# Patient Record
Sex: Female | Born: 1965
Health system: Southern US, Community
[De-identification: ages and names within clinical notes are randomized; demographics above are authoritative.]

## PROBLEM LIST (undated history)

## (undated) DIAGNOSIS — K635 Polyp of colon: Secondary | ICD-10-CM

## (undated) DIAGNOSIS — F329 Major depressive disorder, single episode, unspecified: Secondary | ICD-10-CM

## (undated) DIAGNOSIS — K219 Gastro-esophageal reflux disease without esophagitis: Secondary | ICD-10-CM

## (undated) DIAGNOSIS — F32A Depression, unspecified: Secondary | ICD-10-CM

## (undated) HISTORY — PX: TONSILLECTOMY: SUR1361

## (undated) HISTORY — DX: Polyp of colon: K63.5

## (undated) HISTORY — PX: CHOLECYSTECTOMY: SHX55

## (undated) HISTORY — PX: APPENDECTOMY: SHX54

## (undated) HISTORY — DX: Gastro-esophageal reflux disease without esophagitis: K21.9

## (undated) HISTORY — PX: TUBAL LIGATION: SHX77

## (undated) HISTORY — DX: Major depressive disorder, single episode, unspecified: F32.9

## (undated) HISTORY — DX: Depression, unspecified: F32.A

---

## 2017-06-04 ENCOUNTER — Ambulatory Visit (INDEPENDENT_AMBULATORY_CARE_PROVIDER_SITE_OTHER): Payer: 59 | Admitting: Family Medicine

## 2017-06-04 ENCOUNTER — Ambulatory Visit: Payer: Self-pay | Admitting: Family Medicine

## 2017-06-04 ENCOUNTER — Encounter: Payer: Self-pay | Admitting: Family Medicine

## 2017-06-04 VITALS — BP 110/76 | HR 74 | Temp 98.3°F | Ht 60.0 in | Wt 140.0 lb

## 2017-06-04 DIAGNOSIS — F329 Major depressive disorder, single episode, unspecified: Secondary | ICD-10-CM

## 2017-06-04 DIAGNOSIS — F32A Depression, unspecified: Secondary | ICD-10-CM

## 2017-06-04 DIAGNOSIS — F17210 Nicotine dependence, cigarettes, uncomplicated: Secondary | ICD-10-CM | POA: Diagnosis not present

## 2017-06-04 DIAGNOSIS — F5101 Primary insomnia: Secondary | ICD-10-CM | POA: Diagnosis not present

## 2017-06-04 DIAGNOSIS — Z7689 Persons encountering health services in other specified circumstances: Secondary | ICD-10-CM

## 2017-06-04 DIAGNOSIS — F419 Anxiety disorder, unspecified: Secondary | ICD-10-CM | POA: Diagnosis not present

## 2017-06-04 DIAGNOSIS — K219 Gastro-esophageal reflux disease without esophagitis: Secondary | ICD-10-CM

## 2017-06-04 MED ORDER — TRAZODONE HCL 100 MG PO TABS: 100.0000 mg | ORAL_TABLET | Freq: Every day | ORAL | 1 refills | Status: DC

## 2017-06-04 NOTE — Progress Notes (Addendum)
Patient presents to clinic today to establish care.  SUBJECTIVE: PMH:Pt is a 52 yo female with pmh sig for anxiety, depression, GERD, insomnia.  Pt was previously seen in [place] and at [facility].  GERD: Patient taking omeprazole times several years -Patient endorses having EGD 2016 and colonoscopy in 2014 and 2016. -Patient convinced she produces too much acid. -In the past had gallbladder work-up as it was thought to be the cause of her symptoms.  This was negative per patient.  Anxiety and depression: -Patient endorses ongoing history of both -Currently taking bupropion, venlafaxine, lorazepam, trazodone, hydroxyzine. -Patient states she has been to counseling in the past (2014), but did not feel like it was working for her. -Patient endorses decreased energy, decreased mood, decreased interest in doing things, decreased sleep. -Patient states she likes to leave things "in the past".  Patient states she is living for her grandchildren. -Patient states her mind races when she tries to go to sleep at night. -pt requesting refills.  States has been out of meds.  Has med bottles, rx's filled in numerous states.  Allergies: Aleve-facial edema, ulcers in mouth  Past surgical history: Appendectomy Right ankle repair status post fracture  Social history: Patient is single.  She is currently employed as a Academic librarian.  Patient has 2 children.  Patient endorses smoking 3 to 6 cigarettes/day  And using a vape pen without nicotine.  Patient states she has no desire to quit smoking.  Patient is not currently drinking.  Patient states she is an alcoholic and quit drinking cold [place] in 2014.  Family medical history: Mom-deceased, colon cancer Dad-deceased, COPD, heart disease  Health Maintenance: Immunizations --vaccine 2018, TB test 2019, tetanus vaccine 2018. Colonoscopy --2016 polyps noted and removed. Mammogram --2018 PAP -- 2018   Past Medical History:  Diagnosis Date    . Colon polyps   . Depression   . GERD (gastroesophageal reflux disease)     Past Surgical History:  Procedure Laterality Date  . APPENDECTOMY    . CHOLECYSTECTOMY    . TONSILLECTOMY      No current outpatient medications on file prior to visit.   No current facility-administered medications on file prior to visit.     Allergies  Allergen Reactions  . Aleve [Naproxen Sodium]     Family History  Problem Relation Age of Onset  . Cancer Mother   . Heart disease Mother   . COPD Father     Social History   Socioeconomic History  . Marital status: Unknown    Spouse name: Not on file  . Number of children: Not on file  . Years of education: Not on file  . Highest education level: Not on file  Occupational History  . Not on file  Social Needs  . Financial resource strain: Not on file  . Food insecurity:    Worry: Not on file    Inability: Not on file  . Transportation needs:    Medical: Not on file    Non-medical: Not on file  Tobacco Use  . Smoking status: Current Every Day Smoker    Packs/day: 0.50  . Smokeless tobacco: Current User  Substance and Sexual Activity  . Alcohol use: Never    Frequency: Never  . Drug use: Never  . Sexual activity: Not Currently  Lifestyle  . Physical activity:    Days per week: Not on file    Minutes per session: Not on file  . Stress: Not on file  Relationships  . Social connections:    Talks on phone: Not on file    Gets together: Not on file    Attends religious service: Not on file    Active member of club or organization: Not on file    Attends meetings of clubs or organizations: Not on file    Relationship status: Not on file  . Intimate partner violence:    Fear of current or ex partner: Not on file    Emotionally abused: Not on file    Physically abused: Not on file    Forced sexual activity: Not on file  Other Topics Concern  . Not on file  Social History Narrative  . Not on file    ROS General: Denies  fever, chills, night sweats, changes in weight, changes in appetite HEENT: Denies headaches, ear pain, changes in vision, rhinorrhea, sore throat CV: Denies CP, palpitations, SOB, orthopnea Pulm: Denies SOB, cough, wheezing GI: Denies abdominal pain, nausea, vomiting, diarrhea, constipation  + reflux GU: Denies dysuria, hematuria, frequency, vaginal discharge  Msk: Denies muscle cramps, joint pains Neuro: Denies weakness, numbness, tingling Skin: Denies rashes, bruising Psych: Denies hallucinations  +anxiety and depression  BP 110/76 (BP Location: Left Arm, Patient Position: Sitting, Cuff Size: Normal)   Pulse 74   Temp 98.3 F (36.8 C) (Oral)   Ht 5' (1.524 m)   Wt 140 lb (63.5 kg)   LMP 07/01/2014 (Approximate)   SpO2 99%   BMI 27.34 kg/m   Physical Exam Gen. Pleasant, well developed, well-nourished, in NAD HEENT - [place]/AT, PERRL, no scleral icterus, no nasal drainage, pharynx without erythema or exudate.  TMs normal bilaterally.  No cervical lymphadenopathy. Lungs: no use of accessory muscles, CTAB, no wheezes, rales or rhonchi Cardiovascular: RRR, No r/g/m, no peripheral edema Abdomen: BS present, soft, nontender, nondistended Neuro:  A&Ox3, CN II-XII intact, normal gait Skin:  Warm, dry, intact, no lesions.  Has several tattoos on UEs Psych: normal affect, tearful, mood liable/depressed.  No results found for this or any previous visit (from the past 2160 hour(s)).  Assessment/Plan: Anxiety and depression -PHQ 9 score 12 -GAD 7 score 6 -pt advised to continue current meds: Bupropion, venlafaxine, trazodone, hydroxyzine, lorazepam -Recommended pt find a psychiatric provider in the area.  Given a list.  Patient does not seem amenable to this plan. -Pt still requesting lorazepam. Advised this provider does not prescribe this medication.   Gastroesophageal reflux disease, esophagitis presence not specified -Continue omeprazole -Discussed avoiding foods known to cause  symptoms -Limit NSAID use  Cigarette nicotine dependence without complication -Smoking cessation counseling greater than 3 minutes, less than 10 minutes -Patient smoking 3 to 6 cigarettes/day -Patient is not interested in quitting -We will readdress at each visit  Primary insomnia -continue current meds:  Trazodone, hydroxyzine, lorazepam -pt advised to find a psychiatric provider, given a list of area providers.  Pt does not seem amenable to this plan.  Encounter to establish care -We reviewed the PMH, PSH, FH, SH, Meds and Allergies. -We provided refills for any medications we will prescribe as needed. -We addressed current concerns per orders and patient instructions. -We have asked for records for pertinent exams, studies, vaccines and notes from previous providers. -We have advised patient to follow up per instructions below.  F/u in the next few months prn  Abbe AmsterdamShannon Banks, MD

## 2017-06-04 NOTE — Patient Instructions (Addendum)
Food Choices for Gastroesophageal Reflux Disease, Adult When you have gastroesophageal reflux disease (GERD), the foods you eat and your eating habits are very important. Choosing the right foods can help ease your discomfort. What guidelines do I need to follow?  Choose fruits, vegetables, whole grains, and low-fat dairy products.  Choose low-fat meat, fish, and poultry.  Limit fats such as oils, salad dressings, butter, nuts, and avocado.  Keep a food diary. This helps you identify foods that cause symptoms.  Avoid foods that cause symptoms. These may be different for everyone.  Eat small meals often instead of 3 large meals a day.  Eat your meals slowly, in a place where you are relaxed.  Limit fried foods.  Cook foods using methods other than frying.  Avoid drinking alcohol.  Avoid drinking large amounts of liquids with your meals.  Avoid bending over or lying down until 2-3 hours after eating. What foods are not recommended? These are some foods and drinks that may make your symptoms worse: Vegetables Tomatoes. Tomato juice. Tomato and spaghetti sauce. Chili peppers. Onion and garlic. Horseradish. Fruits Oranges, grapefruit, and lemon (fruit and juice). Meats High-fat meats, fish, and poultry. This includes hot dogs, ribs, ham, sausage, salami, and bacon. Dairy Whole milk and chocolate milk. Sour cream. Cream. Butter. Ice cream. Cream cheese. Drinks Coffee and tea. Bubbly (carbonated) drinks or energy drinks. Condiments Hot sauce. Barbecue sauce. Sweets/Desserts Chocolate and cocoa. Donuts. Peppermint and spearmint. Fats and Oils High-fat foods. This includes French fries and potato chips. Other Vinegar. Strong spices. This includes black pepper, white pepper, red pepper, cayenne, curry powder, cloves, ginger, and chili powder. The items listed above may not be a complete list of foods and drinks to avoid. Contact your dietitian for more information. This  information is not intended to replace advice given to you by your health care provider. Make sure you discuss any questions you have with your health care provider. Document Released: 06/20/2011 Document Revised: 05/27/2015 Document Reviewed: 10/23/2012 Elsevier Interactive Patient Education  2017 [employer]  Gastroesophageal Reflux Disease, Adult Normally, food travels down the esophagus and stays in the stomach to be digested. If a person has gastroesophageal reflux disease (GERD), food and stomach acid move back up into the esophagus. When this happens, the esophagus becomes sore and swollen (inflamed). Over time, GERD can make small holes (ulcers) in the lining of the esophagus. Follow these instructions at home: Diet  Follow a diet as told by your doctor. You may need to avoid foods and drinks such as: ? Coffee and tea (with or without caffeine). ? Drinks that contain alcohol. ? Energy drinks and sports drinks. ? Carbonated drinks or sodas. ? Chocolate and cocoa. ? Peppermint and mint flavorings. ? Garlic and onions. ? Horseradish. ? Spicy and acidic foods, such as peppers, chili powder, curry powder, vinegar, hot sauces, and BBQ sauce. ? Citrus fruit juices and citrus fruits, such as oranges, lemons, and limes. ? Tomato-based foods, such as red sauce, chili, salsa, and pizza with red sauce. ? Fried and fatty foods, such as donuts, french fries, potato chips, and high-fat dressings. ? High-fat meats, such as hot dogs, rib eye steak, sausage, ham, and bacon. ? High-fat dairy items, such as whole milk, butter, and cream cheese.  Eat small meals often. Avoid eating large meals.  Avoid drinking large amounts of liquid with your meals.  Avoid eating meals during the 2-3 hours before bedtime.  Avoid lying down right after you eat.  Do   not exercise right after you eat. General instructions  Pay attention to any changes in your symptoms.  Take over-the-counter and prescription  medicines only as told by your doctor. Do not take aspirin, ibuprofen, or other NSAIDs unless your doctor says it is okay.  Do not use any tobacco products, including cigarettes, chewing tobacco, and e-cigarettes. If you need help quitting, ask your doctor.  Wear loose clothes. Do not wear anything tight around your waist.  Raise (elevate) the head of your bed about 6 inches (15 cm).  Try to lower your stress. If you need help doing this, ask your doctor.  If you are overweight, lose an amount of weight that is healthy for you. Ask your doctor about a safe weight loss goal.  Keep all follow-up visits as told by your doctor. This is important. Contact a doctor if:  You have new symptoms.  You lose weight and you do not know why it is happening.  You have trouble swallowing, or it hurts to swallow.  You have wheezing or a cough that keeps happening.  Your symptoms do not get better with treatment.  You have a hoarse voice. Get help right away if:  You have pain in your arms, neck, jaw, teeth, or back.  You feel sweaty, dizzy, or light-headed.  You have chest pain or shortness of breath.  You throw up (vomit) and your throw up looks like blood or coffee grounds.  You pass out (faint).  Your poop (stool) is bloody or black.  You cannot swallow, drink, or eat. This information is not intended to replace advice given to you by your health care provider. Make sure you discuss any questions you have with your health care provider. Document Released: 06/07/2007 Document Revised: 05/27/2015 Document Reviewed: 04/15/2014 Elsevier Interactive Patient Education  2018 [employer]. Insomnia Insomnia is a sleep disorder that makes it difficult to fall asleep or to stay asleep. Insomnia can cause tiredness (fatigue), low energy, difficulty concentrating, mood swings, and poor performance at work or school. There are three different ways to classify insomnia:  Difficulty falling  asleep.  Difficulty staying asleep.  Waking up too early in the morning.  Any type of insomnia can be long-term (chronic) or short-term (acute). Both are common. Short-term insomnia usually lasts for three months or less. Chronic insomnia occurs at least three times a week for longer than three months. What are the causes? Insomnia may be caused by another condition, situation, or substance, such as:  Anxiety.  Certain medicines.  Gastroesophageal reflux disease (GERD) or other gastrointestinal conditions.  Asthma or other breathing conditions.  Restless legs syndrome, sleep apnea, or other sleep disorders.  Chronic pain.  Menopause. This may include hot flashes.  Stroke.  Abuse of alcohol, tobacco, or illegal drugs.  Depression.  Caffeine.  Neurological disorders, such as Alzheimer disease.  An overactive thyroid (hyperthyroidism).  The cause of insomnia may not be known. What increases the risk? Risk factors for insomnia include:  Gender. Women are more commonly affected than men.  Age. Insomnia is more common as you get older.  Stress. This may involve your professional or personal life.  Income. Insomnia is more common in people with lower income.  Lack of exercise.  Irregular work schedule or night shifts.  Traveling between different time zones.  What are the signs or symptoms? If you have insomnia, trouble falling asleep or trouble staying asleep is the main symptom. This may lead to other symptoms, such as:  Feeling fatigued.  Feeling nervous about going to sleep.  Not feeling rested in the morning.  Having trouble concentrating.  Feeling irritable, anxious, or depressed.  How is this treated? Treatment for insomnia depends on the cause. If your insomnia is caused by an underlying condition, treatment will focus on addressing the condition. Treatment may also include:  Medicines to help you sleep.  Counseling or therapy.  Lifestyle  adjustments.  Follow these instructions at home:  Take medicines only as directed by your health care provider.  Keep regular sleeping and waking hours. Avoid naps.  Keep a sleep diary to help you and your health care provider figure out what could be causing your insomnia. Include: ? When you sleep. ? When you wake up during the night. ? How well you sleep. ? How rested you feel the next day. ? Any side effects of medicines you are taking. ? What you eat and drink.  Make your bedroom a comfortable place where it is easy to fall asleep: ? Put up shades or special blackout curtains to block light from outside. ? Use a white noise machine to block noise. ? Keep the temperature cool.  Exercise regularly as directed by your health care provider. Avoid exercising right before bedtime.  Use relaxation techniques to manage stress. Ask your health care provider to suggest some techniques that may work well for you. These may include: ? Breathing exercises. ? Routines to release muscle tension. ? Visualizing peaceful scenes.  Cut back on alcohol, caffeinated beverages, and cigarettes, especially close to bedtime. These can disrupt your sleep.  Do not overeat or eat spicy foods right before bedtime. This can lead to digestive discomfort that can make it hard for you to sleep.  Limit screen use before bedtime. This includes: ? Watching TV. ? Using your smartphone, tablet, and computer.  Stick to a routine. This can help you fall asleep faster. Try to do a quiet activity, brush your teeth, and go to bed at the same time each night.  Get out of bed if you are still awake after 15 minutes of trying to sleep. Keep the lights down, but try reading or doing a quiet activity. When you feel sleepy, go back to bed.  Make sure that you drive carefully. Avoid driving if you feel very sleepy.  Keep all follow-up appointments as directed by your health care provider. This is important. Contact a  health care provider if:  You are tired throughout the day or have trouble in your daily routine due to sleepiness.  You continue to have sleep problems or your sleep problems get worse. Get help right away if:  You have serious thoughts about hurting yourself or someone else. This information is not intended to replace advice given to you by your health care provider. Make sure you discuss any questions you have with your health care provider. Document Released: 12/17/1999 Document Revised: 05/21/2015 Document Reviewed: 09/19/2013 Elsevier Interactive Patient Education  2018 [employer].  Living With Anxiety After being diagnosed with an anxiety disorder, you may be relieved to know why you have felt or behaved a certain way. It is natural to also feel overwhelmed about the treatment ahead and what it will mean for your life. With care and support, you can manage this condition and recover from it. How to cope with anxiety Dealing with stress Stress is your body's reaction to life changes and events, both good and bad. Stress can last just a few hours or  it can be ongoing. Stress can play a major role in anxiety, so it is important to learn both how to cope with stress and how to think about it differently. Talk with your health care provider or a counselor to learn more about stress reduction. He or she may suggest some stress reduction techniques, such as:  Music therapy. This can include creating or listening to music that you enjoy and that inspires you.  Mindfulness-based meditation. This involves being aware of your normal breaths, rather than trying to control your breathing. It can be done while sitting or walking.  Centering prayer. This is a kind of meditation that involves focusing on a word, phrase, or sacred image that is meaningful to you and that brings you peace.  Deep breathing. To do this, expand your stomach and inhale slowly through your nose. Hold your breath for 3-5  seconds. Then exhale slowly, allowing your stomach muscles to relax.  Self-talk. This is a skill where you identify thought patterns that lead to anxiety reactions and correct those thoughts.  Muscle relaxation. This involves tensing muscles then relaxing them.  Choose a stress reduction technique that fits your lifestyle and personality. Stress reduction techniques take time and practice. Set aside 5-15 minutes a day to do them. Therapists can offer training in these techniques. The training may be covered by some insurance plans. Other things you can do to manage stress include:  Keeping a stress diary. This can help you learn what triggers your stress and ways to control your response.  Thinking about how you respond to certain situations. You may not be able to control everything, but you can control your reaction.  Making time for activities that help you relax, and not feeling guilty about spending your time in this way.  Therapy combined with coping and stress-reduction skills provides the best chance for successful treatment. Medicines Medicines can help ease symptoms. Medicines for anxiety include:  Anti-anxiety drugs.  Antidepressants.  Beta-blockers.  Medicines may be used as the main treatment for anxiety disorder, along with therapy, or if other treatments are not working. Medicines should be prescribed by a health care provider. Relationships Relationships can play a big part in helping you recover. Try to spend more time connecting with trusted friends and family members. Consider going to couples counseling, taking family education classes, or going to family therapy. Therapy can help you and others better understand the condition. How to recognize changes in your condition Everyone has a different response to treatment for anxiety. Recovery from anxiety happens when symptoms decrease and stop interfering with your daily activities at home or work. This may mean that you  will start to:  Have better concentration and focus.  Sleep better.  Be less irritable.  Have more energy.  Have improved memory.  It is important to recognize when your condition is getting worse. Contact your health care provider if your symptoms interfere with home or work and you do not feel like your condition is improving. Where to find help and support: You can get help and support from these sources:  Self-help groups.  Online and [employer].  A trusted spiritual leader.  Couples counseling.  Family education classes.  Family therapy.  Follow these instructions at home:  Eat a healthy diet that includes plenty of vegetables, fruits, whole grains, low-fat dairy products, and lean protein. Do not eat a lot of foods that are high in solid fats, added sugars, or salt.  Exercise. Most adults  should do the following: ? Exercise for at least 150 minutes each week. The exercise should increase your heart rate and make you sweat (moderate-intensity exercise). ? Strengthening exercises at least twice a week.  Cut down on caffeine, tobacco, alcohol, and other potentially harmful substances.  Get the right amount and quality of sleep. Most adults need 7-9 hours of sleep each night.  Make choices that simplify your life.  Take over-the-counter and prescription medicines only as told by your health care provider.  Avoid caffeine, alcohol, and certain over-the-counter cold medicines. These may make you feel worse. Ask your pharmacist which medicines to avoid.  Keep all follow-up visits as told by your health care provider. This is important. Questions to ask your health care provider  Would I benefit from therapy?  How often should I follow up with a health care provider?  How long do I need to take medicine?  Are there any long-term side effects of my medicine?  Are there any alternatives to taking medicine? Contact a health care provider if:  You  have a hard time staying focused or finishing daily tasks.  You spend many hours a day feeling worried about everyday life.  You become exhausted by worry.  You start to have headaches, feel tense, or have nausea.  You urinate more than normal.  You have diarrhea. Get help right away if:  You have a racing heart and shortness of breath.  You have thoughts of hurting yourself or others. If you ever feel like you may hurt yourself or others, or have thoughts about taking your own life, get help right away. You can go to your nearest emergency department or call:  Your local emergency services (911 in the [place]  A suicide crisis helpline, such as the National Suicide Prevention Lifeline at 2532313805. This is open 24-hours a day.  Summary  Taking steps to deal with stress can help calm you.  Medicines cannot cure anxiety disorders, but they can help ease symptoms.  Family, friends, and partners can play a big part in helping you recover from an anxiety disorder. This information is not intended to replace advice given to you by your health care provider. Make sure you discuss any questions you have with your health care provider. Document Released: 12/14/2015 Document Revised: 12/14/2015 Document Reviewed: 12/14/2015 Elsevier Interactive Patient Education  2018 [employer].  Living With Depression Everyone experiences occasional disappointment, sadness, and loss in their lives. When you are feeling down, blue, or sad for at least 2 weeks in a row, it may mean that you have depression. Depression can affect your thoughts and feelings, relationships, daily activities, and physical health. It is caused by changes in the way your brain functions. If you receive a diagnosis of depression, your health care provider will tell you which type of depression you have and what treatment options are available to you. If you are living with depression, there are ways to help you recover from  it and also ways to prevent it from coming back. How to cope with lifestyle changes Coping with stress Stress is your body's reaction to life changes and events, both good and bad. Stressful situations may include:  Getting married.  The death of a spouse.  Losing a job.  Retiring.  Having a baby.  Stress can last just a few hours or it can be ongoing. Stress can play a major role in depression, so it is important to learn both how to cope with stress  and how to think about it differently. Talk with your health care provider or a counselor if you would like to learn more about stress reduction. He or she may suggest some stress reduction techniques, such as:  Music therapy. This can include creating music or listening to music. Choose music that you enjoy and that inspires you.  Mindfulness-based meditation. This kind of meditation can be done while sitting or walking. It involves being aware of your normal breaths, rather than trying to control your breathing.  Centering prayer. This is a kind of meditation that involves focusing on a spiritual word or phrase. Choose a word, phrase, or sacred image that is meaningful to you and that brings you peace.  Deep breathing. To do this, expand your stomach and inhale slowly through your nose. Hold your breath for 3-5 seconds, then exhale slowly, allowing your stomach muscles to relax.  Muscle relaxation. This involves intentionally tensing muscles then relaxing them.  Choose a stress reduction technique that fits your lifestyle and personality. Stress reduction techniques take time and practice to develop. Set aside 5-15 minutes a day to do them. Therapists can offer training in these techniques. The training may be covered by some insurance plans. Other things you can do to manage stress include:  Keeping a stress diary. This can help you learn what triggers your stress and ways to control your response.  Understanding what your limits are  and saying no to requests or events that lead to a schedule that is too full.  Thinking about how you respond to certain situations. You may not be able to control everything, but you can control how you react.  Adding humor to your life by watching funny films or TV shows.  Making time for activities that help you relax and not feeling guilty about spending your time this way.  Medicines Your health care provider may suggest certain medicines if he or she feels that they will help improve your condition. Avoid using alcohol and other substances that may prevent your medicines from working properly (may interact). It is also important to:  Talk with your pharmacist or health care provider about all the medicines that you take, their possible side effects, and what medicines are safe to take together.  Make it your goal to take part in all treatment decisions (shared decision-making). This includes giving input on the side effects of medicines. It is best if shared decision-making with your health care provider is part of your total treatment plan.  If your health care provider prescribes a medicine, you may not notice the full benefits of it for 4-8 weeks. Most people who are treated for depression need to be on medicine for at least 6-12 months after they feel better. If you are taking medicines as part of your treatment, do not stop taking medicines without first talking to your health care provider. You may need to have the medicine slowly decreased (tapered) over time to decrease the risk of harmful side effects. Relationships Your health care provider may suggest family therapy along with individual therapy and drug therapy. While there may not be family problems that are causing you to feel depressed, it is still important to make sure your family learns as much as they can about your mental health. Having your family's support can help make your treatment successful. How to recognize changes  in your condition Everyone has a different response to treatment for depression. Recovery from major depression happens when you have not  had signs of major depression for two months. This may mean that you will start to:  Have more interest in doing activities.  Feel less hopeless than you did 2 months ago.  Have more energy.  Overeat less often, or have better or improving appetite.  Have better concentration.  Your health care provider will work with you to decide the next steps in your recovery. It is also important to recognize when your condition is getting worse. Watch for these signs:  Having fatigue or low energy.  Eating too much or too little.  Sleeping too much or too little.  Feeling restless, agitated, or hopeless.  Having trouble concentrating or making decisions.  Having unexplained physical complaints.  Feeling irritable, angry, or aggressive.  Get help as soon as you or your family members notice these symptoms coming back. How to get support and help from others How to talk with friends and family members about your condition Talking to friends and family members about your condition can provide you with one way to get support and guidance. Reach out to trusted friends or family members, explain your symptoms to them, and let them know that you are working with a health care provider to treat your depression. Financial resources Not all insurance plans cover mental health care, so it is important to check with your insurance carrier. If paying for co-pays or counseling services is a problem, search for a local or county mental health care center. They may be able to offer public mental health care services at low or no cost when you are not able to see a private health care provider. If you are taking medicine for depression, you may be able to get the generic form, which may be less expensive. Some makers of prescription medicines also offer help to patients who  cannot afford the medicines they need. Follow these instructions at home:  Get the right amount and quality of sleep.  Cut down on using caffeine, tobacco, alcohol, and other potentially harmful substances.  Try to exercise, such as walking or lifting small weights.  Take over-the-counter and prescription medicines only as told by your health care provider.  Eat a healthy diet that includes plenty of vegetables, fruits, whole grains, low-fat dairy products, and lean protein. Do not eat a lot of foods that are high in solid fats, added sugars, or salt.  Keep all follow-up visits as told by your health care provider. This is important. Contact a health care provider if:  You stop taking your antidepressant medicines, and you have any of these symptoms: ? Nausea. ? Headache. ? Feeling lightheaded. ? Chills and body aches. ? Not being able to sleep (insomnia).  You or your friends and family think your depression is getting worse. Get help right away if:  You have thoughts of hurting yourself or others. If you ever feel like you may hurt yourself or others, or have thoughts about taking your own life, get help right away. You can go to your nearest emergency department or call:  Your local emergency services (911 in the [place]  A suicide crisis helpline, such as the National Suicide Prevention Lifeline at (785)629-4799. This is open 24-hours a day.  Summary  If you are living with depression, there are ways to help you recover from it and also ways to prevent it from coming back.  Work with your health care team to create a management plan that includes counseling, stress management techniques, and healthy lifestyle habits.  This information is not intended to replace advice given to you by your health care provider. Make sure you discuss any questions you have with your health care provider. Document Released: 11/22/2015 Document Revised: 11/22/2015 Document Reviewed:  11/22/2015 Elsevier Interactive Patient Education  [employer].

## 2017-06-05 ENCOUNTER — Ambulatory Visit: Payer: Self-pay | Admitting: Family Medicine

## 2017-06-21 MED FILL — VENLAFAXINE HCL ER 150 MG C: 150 | 30 days supply | Qty: 30 | Fill #0

## 2017-06-21 MED FILL — BUPROPION HCL XL 300 MG TAB: 300 | 30 days supply | Qty: 30 | Fill #0

## 2017-06-21 MED FILL — traZODone HCL 100 MG TABS: 100 | 60 days supply | Qty: 60 | Fill #0

## 2017-06-21 MED FILL — OMEPRAZOLE DR 40 MG CAPSULE: 40 | 30 days supply | Qty: 30 | Fill #0

## 2017-07-11 ENCOUNTER — Telehealth: Payer: Self-pay

## 2017-07-11 ENCOUNTER — Encounter: Payer: Self-pay | Admitting: Adult Health

## 2017-07-11 ENCOUNTER — Ambulatory Visit (INDEPENDENT_AMBULATORY_CARE_PROVIDER_SITE_OTHER): Payer: 59 | Admitting: Adult Health

## 2017-07-11 VITALS — BP 98/63 | HR 71 | Ht 60.0 in | Wt 142.2 lb

## 2017-07-11 DIAGNOSIS — K5904 Chronic idiopathic constipation: Secondary | ICD-10-CM | POA: Diagnosis not present

## 2017-07-11 DIAGNOSIS — K219 Gastro-esophageal reflux disease without esophagitis: Secondary | ICD-10-CM | POA: Diagnosis not present

## 2017-07-11 DIAGNOSIS — F419 Anxiety disorder, unspecified: Secondary | ICD-10-CM

## 2017-07-11 DIAGNOSIS — F5101 Primary insomnia: Secondary | ICD-10-CM

## 2017-07-11 DIAGNOSIS — F329 Major depressive disorder, single episode, unspecified: Secondary | ICD-10-CM

## 2017-07-11 DIAGNOSIS — F32A Depression, unspecified: Secondary | ICD-10-CM

## 2017-07-11 DIAGNOSIS — Z0000 Encounter for general adult medical examination without abnormal findings: Secondary | ICD-10-CM

## 2017-07-11 MED ORDER — LORAZEPAM 0.5 MG PO TABS: 0.5000 mg | ORAL_TABLET | Freq: Every evening | ORAL | 0 refills | Status: AC | PRN

## 2017-07-11 MED FILL — LORazepam 0.5 MG TABS: 0.5 | 30 days supply | Qty: 30 | Fill #0

## 2017-07-11 NOTE — Patient Instructions (Addendum)
Mediterranean Diet A Mediterranean diet refers to food and lifestyle choices that are based on the traditions of countries located on the [place] Sea. This way of eating has been shown to help prevent certain conditions and improve outcomes for people who have chronic diseases, like kidney disease and heart disease. What are tips for following this plan? Lifestyle  Cook and eat meals together with your family, when possible.  Drink enough fluid to keep your urine clear or pale yellow.  Be physically active every day. This includes: ? Aerobic exercise like running or swimming. ? Leisure activities like gardening, walking, or housework.  Get 7-8 hours of sleep each night.  If recommended by your health care provider, drink red wine in moderation. This means 1 glass a day for nonpregnant women and 2 glasses a day for men. A glass of wine equals 5 oz (150 mL). Reading food labels  Check the serving size of packaged foods. For foods such as rice and pasta, the serving size refers to the amount of cooked product, not dry.  Check the total fat in packaged foods. Avoid foods that have saturated fat or trans fats.  Check the ingredients list for added sugars, such as corn syrup. Shopping  At the grocery store, buy most of your food from the areas near the walls of the store. This includes: ? Fresh fruits and vegetables (produce). ? Grains, beans, nuts, and seeds. Some of these may be available in unpackaged forms or large amounts (in bulk). ? Fresh seafood. ? Poultry and eggs. ? Low-fat dairy products.  Buy whole ingredients instead of prepackaged foods.  Buy fresh fruits and vegetables in-season from local farmers markets.  Buy frozen fruits and vegetables in resealable bags.  If you do not have access to quality fresh seafood, buy precooked frozen shrimp or canned fish, such as tuna, salmon, or sardines.  Buy small amounts of raw or cooked vegetables, salads, or olives from the  deli or salad bar at your store.  Stock your pantry so you always have certain foods on hand, such as olive oil, canned tuna, canned tomatoes, rice, pasta, and beans. Cooking  Cook foods with extra-virgin olive oil instead of using butter or other vegetable oils.  Have meat as a side dish, and have vegetables or grains as your main dish. This means having meat in small portions or adding small amounts of meat to foods like pasta or stew.  Use beans or vegetables instead of meat in common dishes like chili or lasagna.  Experiment with different cooking methods. Try roasting or broiling vegetables instead of steaming or sauteing them.  Add frozen vegetables to soups, stews, pasta, or rice.  Add nuts or seeds for added healthy fat at each meal. You can add these to yogurt, salads, or vegetable dishes.  Marinate fish or vegetables using olive oil, lemon juice, garlic, and fresh herbs. Meal planning  Plan to eat 1 vegetarian meal one day each week. Try to work up to 2 vegetarian meals, if possible.  Eat seafood 2 or more times a week.  Have healthy snacks readily available, such as: ? Vegetable sticks with hummus. ? Greek yogurt. ? Fruit and nut trail mix.  Eat balanced meals throughout the week. This includes: ? Fruit: 2-3 servings a day ? Vegetables: 4-5 servings a day ? Low-fat dairy: 2 servings a day ? Fish, poultry, or lean meat: 1 serving a day ? Beans and legumes: 2 or more servings a week ? Nuts   and seeds: 1-2 servings a day ? Whole grains: 6-8 servings a day ? Extra-virgin olive oil: 3-4 servings a day  Limit red meat and sweets to only a few servings a month What are my food choices?  Mediterranean diet ? Recommended ? Grains: Whole-grain pasta. Brown rice. Bulgar wheat. Polenta. Couscous. Whole-wheat bread. [place]. ? Vegetables: Artichokes. Beets. Broccoli. Cabbage. Carrots. Eggplant. Green beans. Chard. Kale. Spinach. Onions. Leeks. Peas. Squash.  Tomatoes. Peppers. Radishes. ? Fruits: Apples. Apricots. Avocado. Berries. Bananas. Cherries. Dates. Figs. Grapes. Lemons. Melon. Oranges. Peaches. Plums. Pomegranate. ? Meats and other protein foods: Beans. Almonds. Sunflower seeds. Pine nuts. Peanuts. [place]. Salmon. Scallops. Shrimp. [place]. Tilapia. Clams. Oysters. Eggs. ? Dairy: Low-fat milk. Cheese. Greek yogurt. ? Beverages: Water. Red wine. Herbal tea. ? Fats and oils: Extra virgin olive oil. Avocado oil. Grape seed oil. ? Sweets and desserts: [place] yogurt with honey. Baked apples. Poached pears. Trail mix. ? Seasoning and other foods: Basil. Cilantro. Coriander. Cumin. Mint. Parsley. Sage. Rosemary. Tarragon. Garlic. Oregano. Thyme. Pepper. Balsalmic vinegar. Tahini. Hummus. Tomato sauce. Olives. Mushrooms. ? Limit these ? Grains: Prepackaged pasta or rice dishes. Prepackaged cereal with added sugar. ? Vegetables: Deep fried potatoes (french fries). ? Fruits: Fruit canned in syrup. ? Meats and other protein foods: Beef. Pork. Lamb. Poultry with skin. Hot dogs. Berniece Salines. ? Dairy: Ice cream. Sour cream. Whole milk. ? Beverages: Juice. Sugar-sweetened soft drinks. Beer. Liquor and spirits. ? Fats and oils: Butter. Canola oil. Vegetable oil. Beef fat (tallow). Lard. ? Sweets and desserts: Cookies. Cakes. Pies. Candy. ? Seasoning and other foods: Mayonnaise. Premade sauces and marinades. ? The items listed may not be a complete list. Talk with your dietitian about what dietary choices are right for you. Summary  The Mediterranean diet includes both food and lifestyle choices.  Eat a variety of fresh fruits and vegetables, beans, nuts, seeds, and whole grains.  Limit the amount of red meat and sweets that you eat.  Talk with your health care provider about whether it is safe for you to drink red wine in moderation. This means 1 glass a day for nonpregnant women and 2 glasses a day for men. A glass of wine equals 5 oz (150 mL). This information  is not intended to replace advice given to you by your health care provider. Make sure you discuss any questions you have with your health care provider. Document Released: 08/12/2015 Document Revised: 09/14/2015 Document Reviewed: 08/12/2015 Elsevier Interactive Patient Education  2018 [employer].   Exercising to [employer] Exercising can help you to lose weight. In order to lose weight through exercise, you need to do vigorous-intensity exercise. You can tell that you are exercising with vigorous intensity if you are breathing very hard and fast and cannot hold a conversation while exercising. Moderate-intensity exercise helps to maintain your current weight. You can tell that you are exercising at a moderate level if you have a higher heart rate and faster breathing, but you are still able to hold a conversation. How often should I exercise? Choose an activity that you enjoy and set realistic goals. Your health care provider can help you to make an activity plan that works for you. Exercise regularly as directed by your health care provider. This may include:  Doing resistance training twice each week, such as: ? Push-ups. ? Sit-ups. ? Lifting weights. ? Using resistance bands.  Doing a given intensity of exercise for a given amount of time. Choose from these options: ? 150  minutes of moderate-intensity exercise every week. ? 75 minutes of vigorous-intensity exercise every week. ? A mix of moderate-intensity and vigorous-intensity exercise every week.  Children, pregnant women, people who are out of shape, people who are overweight, and older adults may need to consult a health care provider for individual recommendations. If you have any sort of medical condition, be sure to consult your health care provider before starting a new exercise program. What are some activities that can help me to lose weight?  Walking at a rate of at least 4.5 miles an hour.  Jogging or running at a  rate of 5 miles per hour.  Biking at a rate of at least 10 miles per hour.  Lap swimming.  Roller-skating or in-line skating.  Cross-country skiing.  Vigorous competitive sports, such as football, basketball, and soccer.  Jumping rope.  Aerobic dancing. How can I be more active in my day-to-day activities?  Use the stairs instead of the elevator.  Take a walk during your lunch break.  If you drive, park your car farther away from work or school.  If you take public transportation, get off one stop early and walk the rest of the way.  Make all of your phone calls while standing up and walking around.  Get up, stretch, and walk around every 30 minutes throughout the day. What guidelines should I follow while exercising?  Do not exercise so much that you hurt yourself, feel dizzy, or get very short of breath.  Consult your health care provider prior to starting a new exercise program.  Wear comfortable clothes and shoes with good support.  Drink plenty of water while you exercise to prevent dehydration or heat stroke. Body water is lost during exercise and must be replaced.  Work out until you breathe faster and your heart beats faster. This information is not intended to replace advice given to you by your health care provider. Make sure you discuss any questions you have with your health care provider. Document Released: 01/21/2010 Document Revised: 05/27/2015 Document Reviewed: 05/22/2013 Elsevier Interactive Patient Education  2018 [employer]  Continue all medications as directed. Continue your excellent water intake and follow Mediterranean Diet. Continue to increase regular movement. Follow-up in 3 months, re: fasting labs and complete physical. WELCOME TO THE PRACTICE!

## 2017-07-11 NOTE — Assessment & Plan Note (Signed)
Worsening over the last 4 years She has been journaling to pinpoint food to help reduce sx's Mother passed from colon ca Pt's last colonoscopy was 2016- one polyp removed, advised to repeat in 3 years

## 2017-07-11 NOTE — Assessment & Plan Note (Signed)
Due to recent move to area- she is not in [place] Controlled System Database Lorazepam 0.5mg  , 30 ct refilled

## 2017-07-11 NOTE — Telephone Encounter (Signed)
Denny PeonErin from [facility] called stating that pt is taking trazodone for sleep and wanted to be sure Orpha BurKaty was aware of this since an RX for Lorazepam was sent to their pharmacy.  Denny Peonrin states that last RX for trazodone was filled on 06/21/17 for 60 day supply, written by Patton Sallesythia Chambless.    Spoke with patient who stated that she has been on both of these medications for "years" for sleep, however she did not present our office with a bottle for this medication when she presented all of her other RXs in the office earlier today.   Patient asked that inquire for Orpha BurKaty to consider keeping her on both of these medications.  Please advise.  Tiajuana Amass. Rachell Druckenmiller, CMA

## 2017-07-11 NOTE — Assessment & Plan Note (Signed)
Mood stable Declined CBT referral Currently on bupropion XL 300mg  QD and Venlafaxine XR 150mg  QD

## 2017-07-11 NOTE — Assessment & Plan Note (Signed)
Continue all medications as directed. Continue your excellent water intake and follow Mediterranean Diet. Continue to increase regular movement. Follow-up in 3 months, re: fasting labs and complete physical.

## 2017-07-11 NOTE — Assessment & Plan Note (Signed)
Well controlled with Omeprazole 40mg QD 

## 2017-07-11 NOTE — Progress Notes (Signed)
Subjective:    Patient ID: Peggy Rasmussen, female    DOB: [birth year], 52 y.o.   MRN: [id number]  HPI:  Peggy Rasmussen is here to establish as a new pt.  She is a pleasant 52 year old female.   PMH: GERD, Chronic Idiopathic Constipation, Insomnia, Anxiety/Depression, and tobacco use. She experienced major life change in 2014- both parents passed away and she ended long-term relationship. She was unable to remain living/working in [place] due to cost of living and moved to [place] area in Spring 2019 She continues to struggle with anxiety/depression but feels that her current rx regime has "been really helpful with my mood" She denies thoughts of harming herself/others She has strong support of family and is making friends through her new job at [employer]. She has been steadily increasing cardio exercise and once she finishes orientation plans on 60 min exercise sessions 5 days/week She estimates to smoke 1/2 pack/day and will start [place] Smoking Cessation Program- GREAT! She denies ETOH use She estimates to drink >100 oz water/day  Patient Care Team    Relationship Specialty Notifications Start End  William Hamburgeranford, Braydin Aloi D, NP PCP - General Family Medicine  07/11/17   Deeann SaintBanks, Shannon R, MD Consulting Physician Family Medicine  06/04/17   Pleasant, Conception ChancyHenry Newton Jr., MD Referring Physician Obstetrics and Gynecology  07/11/17     Patient Active Problem List   Diagnosis Date Noted  . Healthcare maintenance 07/11/2017  . Chronic idiopathic constipation 07/11/2017  . Anxiety and depression 06/04/2017  . Gastroesophageal reflux disease 06/04/2017  . Cigarette nicotine dependence without complication 06/04/2017  . Primary insomnia 06/04/2017     Past Medical History:  Diagnosis Date  . Colon polyps   . Depression   . GERD (gastroesophageal reflux disease)      Past Surgical History:  Procedure Laterality Date  . APPENDECTOMY    . CHOLECYSTECTOMY    . TONSILLECTOMY    . TUBAL  LIGATION       Family History  Problem Relation Age of Onset  . Cancer Mother        colon  . Heart disease Mother   . COPD Father      Social History   Substance and Sexual Activity  Drug Use Never     Social History   Substance and Sexual Activity  Alcohol Use Never  . Frequency: Never     Social History   Tobacco Use  Smoking Status Current Every Day Smoker  . Packs/day: 0.50  . Years: 20.00  . Pack years: 10.00  Smokeless Tobacco Current User     Outpatient Encounter Medications as of 07/11/2017  Medication Sig  . buPROPion (WELLBUTRIN XL) 300 MG 24 hr tablet Take 300 mg by mouth daily.  . hydrOXYzine (VISTARIL) 25 MG capsule Take 1 capsule by mouth 2 (two) times daily as needed.  Marland Kitchen. LORazepam (ATIVAN) 0.5 MG tablet Take 1 tablet (0.5 mg total) by mouth at bedtime as needed.  Marland Kitchen. omeprazole (PRILOSEC) 40 MG capsule Take 40 mg by mouth daily.  . simethicone (MYLICON) 80 MG chewable tablet Chew 80 mg by mouth as needed for flatulence.  . venlafaxine XR (EFFEXOR-XR) 150 MG 24 hr capsule Take 1 capsule by mouth daily.  . [DISCONTINUED] LORazepam (ATIVAN) 0.5 MG tablet Take 1 tablet by mouth at bedtime as needed.  . [DISCONTINUED] buPROPion (WELLBUTRIN) 100 MG tablet Take 100 mg by mouth 2 (two) times daily.  . [DISCONTINUED] hydrOXYzine (ATARAX/VISTARIL) 25 MG  tablet Take 25 mg by mouth as needed.  . [DISCONTINUED] LORazepam (ATIVAN) 0.5 MG tablet Take 0.5 mg by mouth daily.  . [DISCONTINUED] traZODone (DESYREL) 100 MG tablet Take 1 tablet (100 mg total) by mouth at bedtime.   No facility-administered encounter medications on file as of 07/11/2017.     Allergies: Aleve [naproxen sodium]  Body mass index is 27.77 kg/m.  Blood pressure 98/63, pulse 71, height 5' (1.524 m), weight 142 lb 3.2 oz (64.5 kg), last menstrual period 07/01/2014, SpO2 99 %. Her BP runs on lower side  Review of Systems  Constitutional: Positive for fatigue and unexpected weight  change. Negative for activity change, appetite change, chills, diaphoresis and fever.  HENT: Negative for congestion.   Eyes: Negative for visual disturbance.  Respiratory: Negative for cough, chest tightness, shortness of breath, wheezing and stridor.   Cardiovascular: Negative for chest pain, palpitations and leg swelling.  Gastrointestinal: Positive for constipation. Negative for abdominal distention, abdominal pain, blood in stool, diarrhea, nausea and vomiting.  Endocrine: Negative for cold intolerance, heat intolerance, polydipsia, polyphagia and polyuria.  Genitourinary: Negative for difficulty urinating and flank pain.  Skin: Negative for color change, pallor, rash and wound.  Neurological: Negative for dizziness and headaches.  Hematological: Does not bruise/bleed easily.  Psychiatric/Behavioral: Positive for dysphoric mood and sleep disturbance. Negative for behavioral problems, confusion, decreased concentration, hallucinations, self-injury and suicidal ideas. The patient is nervous/anxious. The patient is not hyperactive.        Objective:   Physical Exam  Constitutional: She is oriented to person, place, and time. She appears well-developed and well-nourished. No distress.  HENT:  Head: Normocephalic and atraumatic.  Right Ear: External ear normal.  Left Ear: External ear normal.  Nose: Nose normal.  Mouth/Throat: Oropharynx is clear and moist.  Cardiovascular: Normal rate, regular rhythm, normal heart sounds and intact distal pulses.  No murmur heard. Pulmonary/Chest: Effort normal and breath sounds normal. No stridor. No respiratory distress. She has no wheezes. She has no rales. She exhibits no tenderness.  Neurological: She is alert and oriented to person, place, and time.  Skin: Skin is warm and dry. Capillary refill takes less than 2 seconds. No rash noted. She is not diaphoretic. No erythema. No pallor.  Psychiatric: She has a normal mood and affect. Her behavior is  normal. Judgment and thought content normal.  Nursing note and vitals reviewed.     Assessment & Plan:   1. Healthcare maintenance   2. Anxiety and depression   3. Primary insomnia   4. Gastroesophageal reflux disease, esophagitis presence not specified   5. Chronic idiopathic constipation     Healthcare maintenance Continue all medications as directed. Continue your excellent water intake and follow Mediterranean Diet. Continue to increase regular movement. Follow-up in 3 months, re: fasting labs and complete physical.  Anxiety and depression Mood stable Declined CBT referral Currently on bupropion XL 300mg  QD and Venlafaxine XR 150mg  QD   Primary insomnia Due to recent move to area- she is not in [place] Controlled System Database Lorazepam 0.5mg  , 30 ct refilled   Gastroesophageal reflux disease Well controlled with Omeprazole 40mg  QD  Chronic idiopathic constipation Worsening over the last 4 years She has been journaling to pinpoint food to help reduce sx's Mother passed from colon ca Pt's last colonoscopy was 2016- one polyp removed, advised to repeat in 3 years     FOLLOW-UP:  Return in about 3 months (around 10/11/2017) for CPE, Fasting Labs.

## 2017-07-12 NOTE — Telephone Encounter (Signed)
Good Morning Tonya, We will fill EITHER Trazodone or Lorazepam- not both. Please call the pt and ask her which one she would like to continue for treatment of insomnia. Then please update system and inform pharmacy. Thanks! Orpha BurKaty

## 2017-07-12 NOTE — Telephone Encounter (Signed)
Attempted to contact pt several times by phone.  Numbers listed in the system for home and mobile have numbers transposed.  I have attempted both numbers.  One number is incorrect and the second number says that call cannot be completed as dialed.  Will await for pt to call back to discuss since lorazepam RX has been placed on hold at the pharmacy.  Tiajuana Amass. Nelson, CMA

## 2017-07-13 ENCOUNTER — Encounter: Payer: Self-pay | Admitting: Adult Health

## 2017-07-13 ENCOUNTER — Other Ambulatory Visit: Payer: Self-pay | Admitting: Adult Health

## 2017-07-13 ENCOUNTER — Telehealth: Payer: Self-pay | Admitting: Adult Health

## 2017-07-13 NOTE — Telephone Encounter (Signed)
Called patient and notified her that Orpha BurKaty is out of the office until Monday. MPulliam, CMA/RT(R)

## 2017-07-13 NOTE — Telephone Encounter (Signed)
Pt called to ask if Rx was going to be released to pharmacy--advised medical asst/Tonya had made attempts to contact her but unfortunately something had happened to our clinic office phone lines & they were inoperative ( no calls could be rcvd Nor could we call out until telecommunication corrected error)  Patient states she is still waiting on Rx to be [facility]--advised I show it was done on 7/10 but placed on hold by provider until (Patient's ) decision on which one she wanted & that's what Archie Pattenonya was trying to contact her about. Pt states she wants both because she has been taking Both for years--- advised her that I would have Melissa review provider notes & call her back.  --forward request to Medical Assistant/Melissa Pullian who reviewed provider & other MA's notes and called pt back.  --Forwarding message to Tonya/ medical assistant to NP-C, William HamburgerKaty Danford as an FYI & to contact pt on Monday which return to office.  --glh

## 2017-07-13 NOTE — Telephone Encounter (Signed)
Attempted to contact pt several times by phone.  Numbers listed in the system for home and mobile have numbers transposed.  I have attempted both numbers.  One number is incorrect and the second number says that call cannot be completed as dialed.  Called [facility] to inform them to continue to hold lorazepam RX.  Pharmacy representative stated that the RX has already been filled and couriered up to [employer] where patient works.  I inquired as to why the RX was filled after I called yesterday and put the RX on hold.  Pharmacy states that they did not have a note that the RX was to be put on hold and that the patient informed them that she is taking both Trazodone and Lorazepam for sleep.  Pharmacy states they will try to contact courier to not deliver the RX to the patient.  Tiajuana Amass. Nelson, CMA

## 2017-07-16 ENCOUNTER — Other Ambulatory Visit: Payer: Self-pay

## 2017-07-16 ENCOUNTER — Encounter: Payer: Self-pay | Admitting: Adult Health

## 2017-07-16 ENCOUNTER — Telehealth: Payer: Self-pay | Admitting: Adult Health

## 2017-07-16 MED ORDER — TRAZODONE HCL 100 MG PO TABS: 100.0000 mg | ORAL_TABLET | Freq: Every day | ORAL | 2 refills | Status: AC

## 2017-07-16 MED ORDER — OMEPRAZOLE 40 MG PO CPDR: 40.0000 mg | DELAYED_RELEASE_CAPSULE | Freq: Every day | ORAL | 0 refills | Status: DC

## 2017-07-16 MED ORDER — VENLAFAXINE HCL ER 150 MG PO CP24: 150.0000 mg | ORAL_CAPSULE | Freq: Every day | ORAL | 0 refills | Status: DC

## 2017-07-16 MED ORDER — HYDROXYZINE PAMOATE 25 MG PO CAPS: 25.0000 mg | ORAL_CAPSULE | Freq: Two times a day (BID) | ORAL | 0 refills | Status: AC | PRN

## 2017-07-16 MED ORDER — BUPROPION HCL ER (XL) 300 MG PO TB24: 300.0000 mg | ORAL_TABLET | Freq: Every day | ORAL | 0 refills | Status: DC

## 2017-07-16 MED FILL — VENLAFAXINE HCL ER 150 MG C: 150 | 90 days supply | Qty: 90 | Fill #0

## 2017-07-16 MED FILL — OMEPRAZOLE DR 40 MG CAPSULE: 40 | 90 days supply | Qty: 90 | Fill #0

## 2017-07-16 MED FILL — HYDROXYZINE PAM 25 MG CAP: 25 | 90 days supply | Qty: 180 | Fill #0

## 2017-07-16 MED FILL — BUPROPION HCL XL 300 MG TAB: 300 | 90 days supply | Qty: 90 | Fill #0

## 2017-07-16 NOTE — Telephone Encounter (Signed)
Done.  T. Nelson, CMA °

## 2017-07-16 NOTE — Telephone Encounter (Signed)
Darl PikesSusan at [facility] needs to get clarification of med refills. She can be reached at [phone]

## 2017-07-16 NOTE — Telephone Encounter (Signed)
We have not prescribed these medications for the patient previously.  Please review and refill if appropriate.  T. Amabel Stmarie, CMA  

## 2017-07-16 NOTE — Telephone Encounter (Signed)
[name] at [facility] needs to get clarification of med refills. She can be reached at [phone] °

## 2017-07-16 NOTE — Telephone Encounter (Signed)
Good Morning Peggy Rasmussen, 1) Can you please update Peggy Rasmussen's chart to reflect- Trazodone 100mg  QHS insomnia and Lorazepam 0.5mg  QHS PRN. 2) She will be kept at max dose of Lorazepam 0.5mg  and is to be used only sparingly. 3) Can you please allow the pharmacy to fill the Lorazepam. Thanks! Orpha BurKaty

## 2017-07-16 NOTE — Telephone Encounter (Signed)
Confirmed with [facility]Susan at [facility] that it is ok to release lorazepam RX.  Peggy Rasmussen. Trase Bunda, CMA

## 2017-07-16 NOTE — Telephone Encounter (Signed)
See multiple additional messages in chart.  RX for lorazepam released to pharmacy.  Peggy Rasmussen. Nelson, CMA

## 2017-08-24 DIAGNOSIS — G4700 Insomnia, unspecified: Secondary | ICD-10-CM | POA: Diagnosis not present

## 2017-08-24 DIAGNOSIS — K219 Gastro-esophageal reflux disease without esophagitis: Secondary | ICD-10-CM | POA: Diagnosis not present

## 2017-08-24 DIAGNOSIS — F419 Anxiety disorder, unspecified: Secondary | ICD-10-CM | POA: Diagnosis not present

## 2017-08-24 DIAGNOSIS — Z79899 Other long term (current) drug therapy: Secondary | ICD-10-CM | POA: Diagnosis not present

## 2017-08-24 DIAGNOSIS — R5383 Other fatigue: Secondary | ICD-10-CM | POA: Diagnosis not present

## 2017-08-24 DIAGNOSIS — F320 Major depressive disorder, single episode, mild: Secondary | ICD-10-CM | POA: Diagnosis not present

## 2017-08-27 MED FILL — LORazepam 0.5 MG TABS: 0.5 | 30 days supply | Qty: 30 | Fill #0

## 2017-10-05 ENCOUNTER — Other Ambulatory Visit (HOSPITAL_COMMUNITY)
Admission: RE | Admit: 2017-10-05 | Discharge: 2017-10-05 | Disposition: A | Discharge status: Home / Self Care | Payer: 59 | Source: Ambulatory Visit | Attending: Family Medicine | Admitting: Family Medicine

## 2017-10-05 DIAGNOSIS — Z79899 Other long term (current) drug therapy: Secondary | ICD-10-CM | POA: Diagnosis not present

## 2017-10-05 DIAGNOSIS — R5383 Other fatigue: Secondary | ICD-10-CM | POA: Diagnosis not present

## 2017-10-05 DIAGNOSIS — Z0000 Encounter for general adult medical examination without abnormal findings: Secondary | ICD-10-CM | POA: Diagnosis not present

## 2017-10-05 LAB — COMPREHENSIVE METABOLIC PANEL
ALBUMIN: 4.2 g/dL (ref 3.5–5.0)
ALK PHOS: 102 U/L (ref 38–126)
ALT: 28 U/L (ref 0–44)
AST: 25 U/L (ref 15–41)
Anion gap: 8 (ref 5–15)
BUN: 19 mg/dL (ref 6–20)
CO2: 26 mmol/L (ref 22–32)
CREATININE: 0.76 mg/dL (ref 0.44–1.00)
Calcium: 9.3 mg/dL (ref 8.9–10.3)
Chloride: 106 mmol/L (ref 98–111)
GFR calc Af Amer: >60 mL/min (ref >60)
GFR calc non Af Amer: >60 mL/min (ref >60)
Glucose, Bld: 107 mg/dL — ABNORMAL HIGH (ref 70–99)
POTASSIUM: 4.1 mmol/L (ref 3.5–5.1)
Sodium: 140 mmol/L (ref 135–145)
TOTAL PROTEIN: 7.6 g/dL (ref 6.5–8.1)
Total Bilirubin: 0.4 mg/dL (ref 0.3–1.2)

## 2017-10-05 LAB — VITAMIN B12: Vitamin B-12: 828 pg/mL (ref 180–914)

## 2017-10-05 LAB — CBC WITH DIFFERENTIAL/PLATELET
Basophils Absolute: 0.1 10*3/uL (ref 0.0–0.1)
Basophils Relative: 1 %
EOS ABS: 0.2 10*3/uL (ref 0.0–0.7)
Eosinophils Relative: 4 %
HEMATOCRIT: 41.1 % (ref 36.0–46.0)
Hemoglobin: 14.6 g/dL (ref 12.0–15.0)
Lymphocytes Relative: 19 %
Lymphs Abs: 1 10*3/uL (ref 0.7–4.0)
MCH: 32.3 pg (ref 26.0–34.0)
MCHC: 35.5 g/dL (ref 30.0–36.0)
MCV: 90.9 fL (ref 78.0–100.0)
Monocytes Absolute: 0.4 10*3/uL (ref 0.1–1.0)
Monocytes Relative: 7 %
NEUTROS ABS: 3.7 10*3/uL (ref 1.7–7.7)
NEUTROS PCT: 69 %
PLATELETS: 270 10*3/uL (ref 150–400)
RBC: 4.52 MIL/uL (ref 3.87–5.11)
RDW: 12.3 % (ref 11.5–15.5)
WBC: 5.4 10*3/uL (ref 4.0–10.5)

## 2017-10-05 LAB — URINALYSIS, ROUTINE W REFLEX MICROSCOPIC
BILIRUBIN URINE: NEGATIVE
Bacteria, UA: NONE SEEN
GLUCOSE, UA: NEGATIVE mg/dL
Hgb urine dipstick: NEGATIVE
KETONES UR: NEGATIVE mg/dL
NITRITE: NEGATIVE
PH: 6 (ref 5.0–8.0)
Protein, ur: NEGATIVE mg/dL
SPECIFIC GRAVITY, URINE: 1.02 (ref 1.005–1.030)

## 2017-10-05 LAB — LIPID PANEL
Cholesterol: 209 mg/dL — ABNORMAL HIGH (ref 0–200)
HDL: 42 mg/dL (ref >40)
LDL CALC: 151 mg/dL — AB (ref 0–99)
Total CHOL/HDL Ratio: 5 RATIO
Triglycerides: 82 mg/dL (ref <150)
VLDL: 16 mg/dL (ref 0–40)

## 2017-10-05 LAB — TSH: TSH: 3.355 u[IU]/mL (ref 0.350–4.500)

## 2017-10-06 LAB — HIV ANTIBODY (ROUTINE TESTING W REFLEX): HIV SCREEN 4TH GENERATION: NONREACTIVE

## 2017-10-06 LAB — HEMOGLOBIN A1C
HEMOGLOBIN A1C: 5.1 % (ref 4.8–5.6)
Mean Plasma Glucose: 100 mg/dL

## 2017-10-08 ENCOUNTER — Encounter: Payer: 59 | Admitting: Adult Health

## 2017-10-12 DIAGNOSIS — Z6827 Body mass index (BMI) 27.0-27.9, adult: Secondary | ICD-10-CM | POA: Diagnosis not present

## 2017-10-12 DIAGNOSIS — F419 Anxiety disorder, unspecified: Secondary | ICD-10-CM | POA: Diagnosis not present

## 2017-10-12 DIAGNOSIS — K219 Gastro-esophageal reflux disease without esophagitis: Secondary | ICD-10-CM | POA: Diagnosis not present

## 2017-10-12 DIAGNOSIS — Z0000 Encounter for general adult medical examination without abnormal findings: Secondary | ICD-10-CM | POA: Diagnosis not present

## 2017-10-12 DIAGNOSIS — F320 Major depressive disorder, single episode, mild: Secondary | ICD-10-CM | POA: Diagnosis not present

## 2017-10-12 DIAGNOSIS — K581 Irritable bowel syndrome with constipation: Secondary | ICD-10-CM | POA: Diagnosis not present

## 2017-10-12 DIAGNOSIS — Z79899 Other long term (current) drug therapy: Secondary | ICD-10-CM | POA: Diagnosis not present

## 2017-10-12 DIAGNOSIS — G4700 Insomnia, unspecified: Secondary | ICD-10-CM | POA: Diagnosis not present

## 2017-10-12 DIAGNOSIS — F17210 Nicotine dependence, cigarettes, uncomplicated: Secondary | ICD-10-CM | POA: Diagnosis not present

## 2017-10-12 MED FILL — LORazepam 0.5 MG TABS: 0.5 | 30 days supply | Qty: 30 | Fill #0

## 2017-10-22 ENCOUNTER — Other Ambulatory Visit: Payer: Self-pay | Admitting: Adult Health

## 2017-10-22 MED FILL — traZODone HCL 100 MG TABS: 100 | 30 days supply | Qty: 30 | Fill #0

## 2017-10-22 MED FILL — OMEPRAZOLE 40 MG CPDR: 40 | 30 days supply | Qty: 30 | Fill #0

## 2017-10-22 MED FILL — VENLAFAXINE HCL ER 150 MG C: 150 | 30 days supply | Qty: 30 | Fill #0

## 2017-10-22 MED FILL — BuPROPion HCL ER (XL) 300 M: 300 | 30 days supply | Qty: 30 | Fill #0

## 2017-10-31 ENCOUNTER — Other Ambulatory Visit (HOSPITAL_COMMUNITY): Payer: Self-pay | Admitting: Family Medicine

## 2017-10-31 DIAGNOSIS — J3089 Other allergic rhinitis: Secondary | ICD-10-CM | POA: Diagnosis not present

## 2017-10-31 DIAGNOSIS — R05 Cough: Secondary | ICD-10-CM | POA: Diagnosis not present

## 2017-10-31 DIAGNOSIS — Z1231 Encounter for screening mammogram for malignant neoplasm of breast: Secondary | ICD-10-CM

## 2017-10-31 DIAGNOSIS — B349 Viral infection, unspecified: Secondary | ICD-10-CM | POA: Diagnosis not present

## 2017-10-31 DIAGNOSIS — R0602 Shortness of breath: Secondary | ICD-10-CM | POA: Diagnosis not present

## 2017-10-31 MED FILL — PROAIR HFA 90 MCG INHALER: 108 (90 BAS | 25 days supply | Qty: 9 | Fill #0

## 2017-10-31 MED FILL — FLUTICASONE PROP 50 MCG SPR: 50 | 30 days supply | Qty: 16 | Fill #0

## 2017-10-31 MED FILL — NOREL AD TABLET: 4-10-325 | 4 days supply | Qty: 20 | Fill #0

## 2017-11-15 ENCOUNTER — Ambulatory Visit (HOSPITAL_COMMUNITY)
Admission: RE | Admit: 2017-11-15 | Discharge: 2017-11-15 | Disposition: A | Discharge status: Home / Self Care | Payer: 59 | Source: Ambulatory Visit | Attending: Family Medicine | Admitting: Family Medicine

## 2017-11-15 DIAGNOSIS — Z1231 Encounter for screening mammogram for malignant neoplasm of breast: Secondary | ICD-10-CM | POA: Diagnosis not present

## 2017-11-19 ENCOUNTER — Other Ambulatory Visit: Payer: Self-pay | Admitting: Adult Health

## 2017-11-19 MED FILL — VENLAFAXINE HCL ER 150 MG C: 150 | 30 days supply | Qty: 30 | Fill #1

## 2017-11-19 MED FILL — OMEPRAZOLE 40 MG CPDR: 40 | 15 days supply | Qty: 15 | Fill #0

## 2017-11-19 MED FILL — traZODone HCL 100 MG TABS: 100 | 30 days supply | Qty: 30 | Fill #1

## 2017-11-19 MED FILL — BuPROPion HCL ER (XL) 300 M: 300 | 15 days supply | Qty: 15 | Fill #0

## 2017-11-19 MED FILL — LORazepam 0.5 MG TABS: 0.5 | 30 days supply | Qty: 30 | Fill #1

## 2017-12-04 MED FILL — buPROPion HCL ER (XL) 300 M: 300 | 30 days supply | Qty: 30 | Fill #0

## 2017-12-04 MED FILL — OMEPRAZOLE 40 MG CPDR: 40 | 30 days supply | Qty: 30 | Fill #0

## 2019-08-26 IMAGING — MG DIGITAL SCREENING BILATERAL MAMMOGRAM WITH TOMO AND CAD
8 series · 8 of 24 positions shown · non-contrast
Comparison: Previous exam(s).

CLINICAL DATA: Screening.

EXAM:
DIGITAL SCREENING BILATERAL MAMMOGRAM WITH TOMO AND CAD

[R MLO synth-2D]
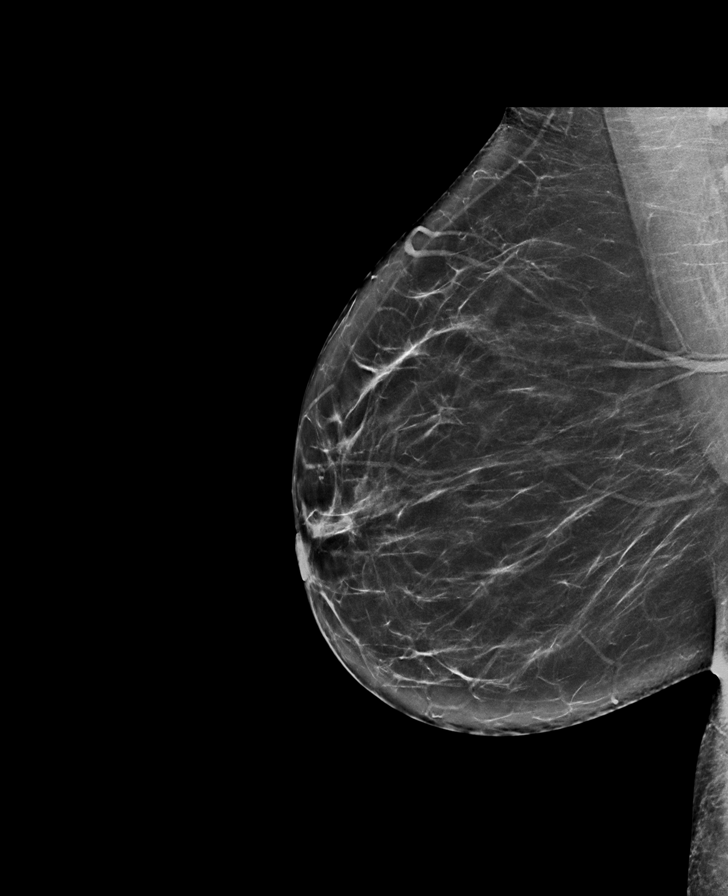

[L MLO synth-2D]
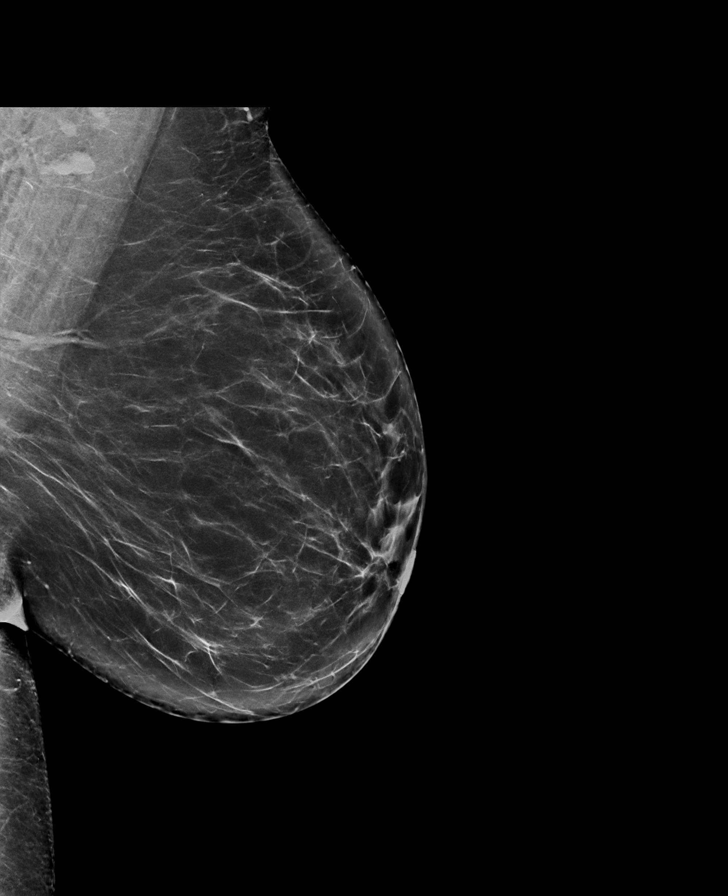

[R CC synth-2D]
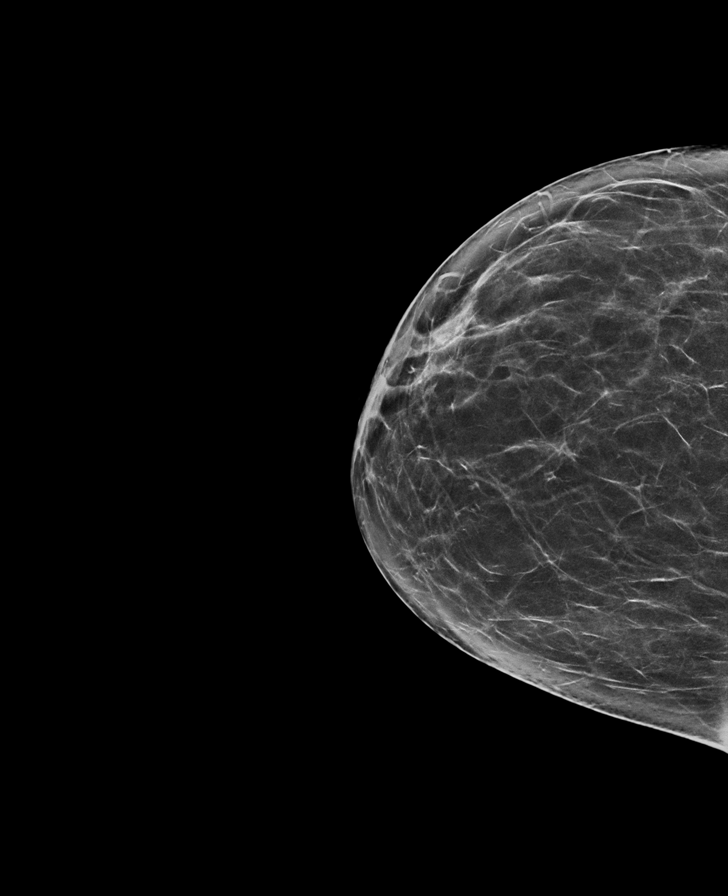

[L CC synth-2D]
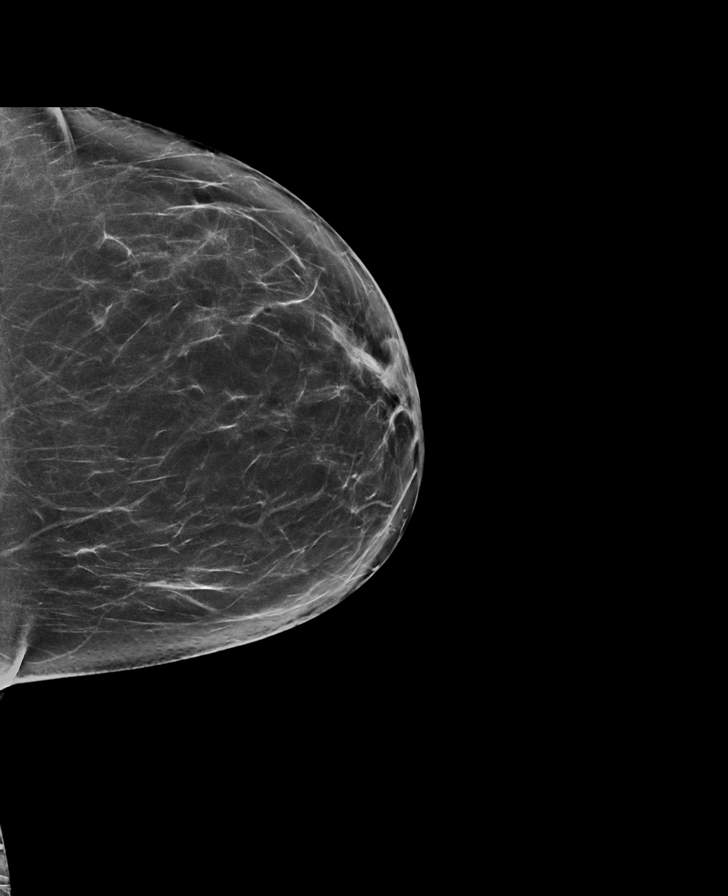

[L MLO tomo · tomo slice 42/83.0]
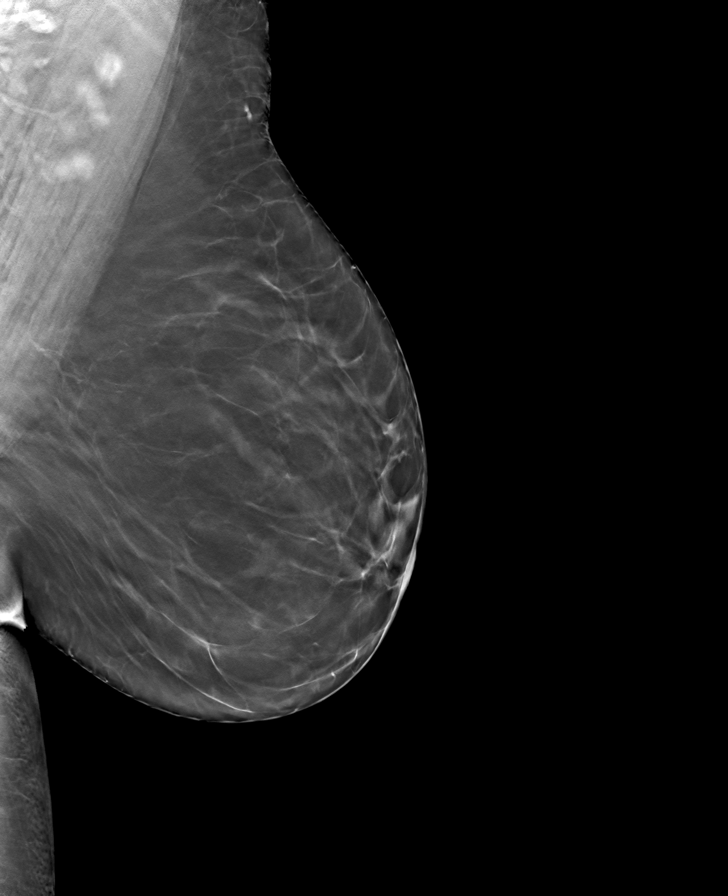

[R MLO tomo · tomo slice 38/75.0]
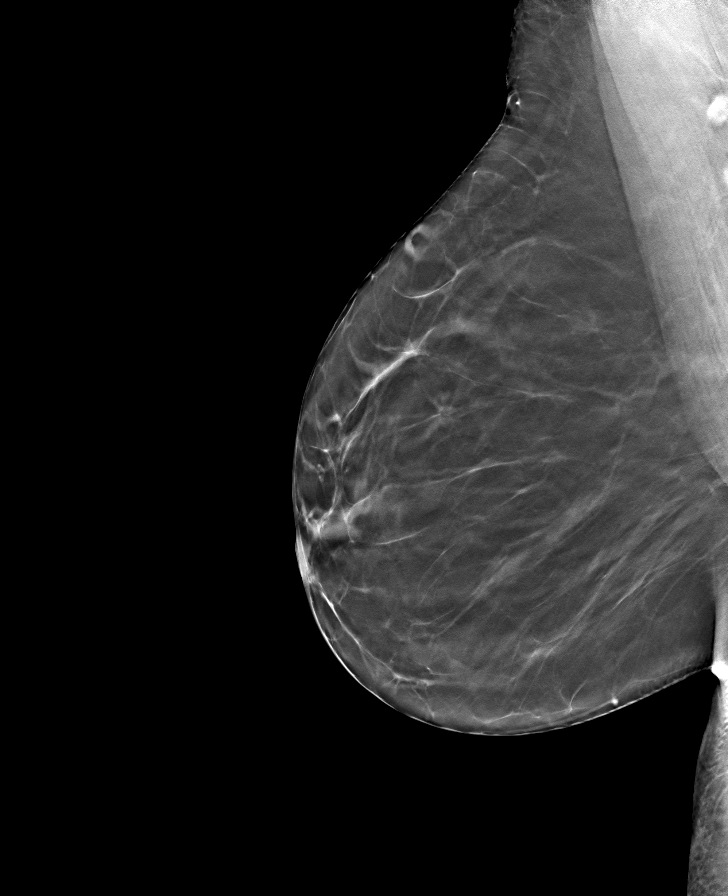

[R CC tomo · tomo slice 35/69.0]
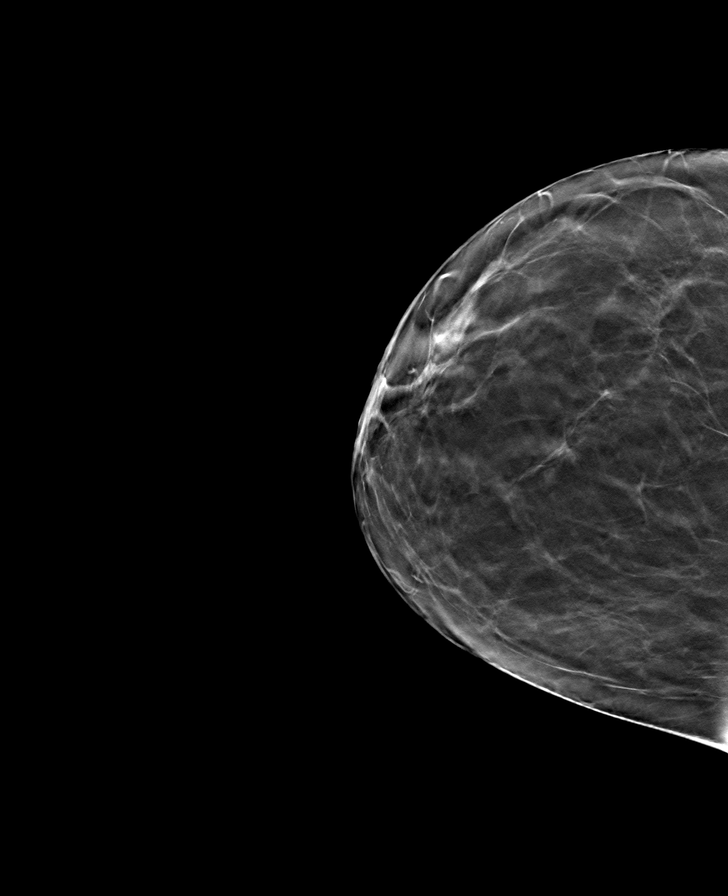

[L CC tomo · tomo slice 39/77.0]
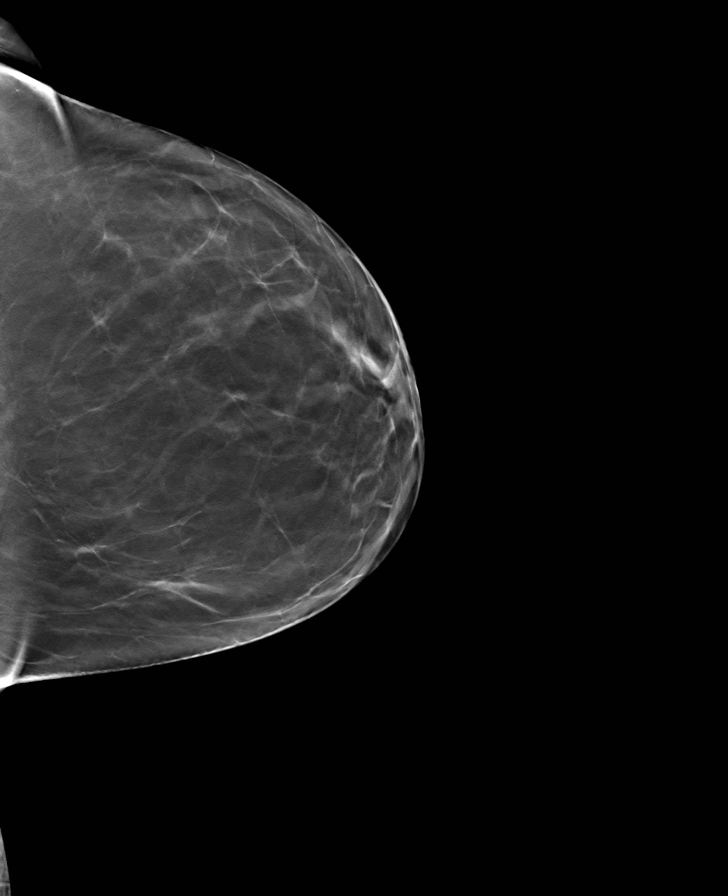

[8 of 24 positions shown; findings below may reference images not displayed]

ACR Breast Density Category b: There are scattered areas of
fibroglandular density.
FINDINGS: There are no findings suspicious for malignancy. Images were
processed with CAD.
IMPRESSION: No mammographic evidence of malignancy. A result letter of this
screening mammogram will be mailed directly to the patient.

RECOMMENDATION:
Screening mammogram in one year. (Code:[id number])

BI-RADS CATEGORY  1: Negative.
